# Patient Record
Sex: Female | Born: 1970 | Race: White | Hispanic: No | Marital: Married | Smoking: Never smoker
Health system: Southern US, Community
[De-identification: ages and names within clinical notes are randomized; demographics above are authoritative.]

## PROBLEM LIST (undated history)

## (undated) DIAGNOSIS — G43909 Migraine, unspecified, not intractable, without status migrainosus: Secondary | ICD-10-CM

## (undated) DIAGNOSIS — F988 Other specified behavioral and emotional disorders with onset usually occurring in childhood and adolescence: Secondary | ICD-10-CM

## (undated) HISTORY — DX: Other specified behavioral and emotional disorders with onset usually occurring in childhood and adolescence: F98.8

## (undated) HISTORY — DX: Migraine, unspecified, not intractable, without status migrainosus: G43.909

## (undated) HISTORY — PX: BREAST EXCISIONAL BIOPSY: SUR124

---

## 1998-11-22 ENCOUNTER — Other Ambulatory Visit: Admission: RE | Admit: 1998-11-22 | Discharge: 1998-11-22 | Payer: Self-pay | Admitting: Obstetrics and Gynecology

## 1999-12-30 ENCOUNTER — Other Ambulatory Visit: Admission: RE | Admit: 1999-12-30 | Discharge: 1999-12-30 | Payer: Self-pay | Admitting: Obstetrics and Gynecology

## 2000-10-02 ENCOUNTER — Encounter (INDEPENDENT_AMBULATORY_CARE_PROVIDER_SITE_OTHER): Payer: Self-pay | Admitting: Specialist

## 2000-10-02 ENCOUNTER — Ambulatory Visit (HOSPITAL_COMMUNITY): Admission: RE | Admit: 2000-10-02 | Discharge: 2000-10-02 | Payer: Self-pay | Admitting: *Deleted

## 2001-03-03 ENCOUNTER — Other Ambulatory Visit: Admission: RE | Admit: 2001-03-03 | Discharge: 2001-03-03 | Payer: Self-pay | Admitting: Obstetrics and Gynecology

## 2002-12-02 ENCOUNTER — Other Ambulatory Visit: Admission: RE | Admit: 2002-12-02 | Discharge: 2002-12-02 | Payer: Self-pay | Admitting: Obstetrics and Gynecology

## 2004-04-30 ENCOUNTER — Emergency Department (HOSPITAL_COMMUNITY): Admission: EM | Admit: 2004-04-30 | Discharge: 2004-04-30 | Payer: Self-pay | Admitting: Emergency Medicine

## 2004-10-31 ENCOUNTER — Other Ambulatory Visit: Admission: RE | Admit: 2004-10-31 | Discharge: 2004-10-31 | Payer: Self-pay | Admitting: Obstetrics and Gynecology

## 2005-10-03 ENCOUNTER — Inpatient Hospital Stay (HOSPITAL_COMMUNITY): Admission: AD | Admit: 2005-10-03 | Discharge: 2005-10-05 | Payer: Self-pay | Admitting: Obstetrics & Gynecology

## 2009-07-24 ENCOUNTER — Encounter: Admission: RE | Admit: 2009-07-24 | Discharge: 2009-07-24 | Payer: Self-pay | Admitting: Obstetrics and Gynecology

## 2010-07-16 ENCOUNTER — Other Ambulatory Visit: Payer: Self-pay | Admitting: Obstetrics and Gynecology

## 2010-07-16 DIAGNOSIS — Z1231 Encounter for screening mammogram for malignant neoplasm of breast: Secondary | ICD-10-CM

## 2010-07-29 ENCOUNTER — Ambulatory Visit: Payer: Self-pay

## 2010-07-30 ENCOUNTER — Ambulatory Visit: Payer: Self-pay

## 2010-08-02 NOTE — Discharge Summary (Signed)
NAMEARVADA, SEABORN                  ACCOUNT NO.:  [id number]   MEDICAL RECORD NO.:  [id number]          PATIENT TYPE:  INP   LOCATION:  9141                          FACILITY:  [facility]   PHYSICIAN:  Gerrit Friends. Aldona Bar, M.D.   DATE OF BIRTH:  04/27/1970   DATE OF ADMISSION:  10/03/2005  DATE OF DISCHARGE:  10/05/2005                                 DISCHARGE SUMMARY   DISCHARGE DIAGNOSES:  1.  Term pregnancy, delivered 7 pounds 10 ounce female infant, Apgars 9 and 9.  2.  Blood type O+.   PROCEDURES:  1.  Normal spontaneous delivery.  2.  Midline episiotomy repair.   SUMMARY:  This 40 year old primigravida was admitted at term, in active  labor, after an uncomplicated pregnancy.  She was followed in labor by Dr.  Tenny Craw,  requested and received an epidural. She had good progression and  subsequently had a normal spontaneous delivery of a viable 7 pound 10 ounce  female infant with Apgars of 9 and 9 over [place] over an episiotomy which was cut  because of the fear of extensive tearing.  Her postpartum course was benign.  Her discharge hemoglobin was 7.8 with a white count of 16,000 and a platelet  count of 175,000.  On the morning of July 22nd she was ambulating well,  tolerating a regular diet well, having normal bowel and bladder function,  and was afebrile.  Her bottle feeding was going well and she was desirous of  discharge.  She was given all appropriate instructions and understood all  instructions well.  Discharge medications include vitamins one a day until  gone, Feosol capsules one five times a week, and she was given a  prescription for Motrin 16 mg to use q.6h. as needed for pain or cramps.  She was given instruction brochure at the time of discharge and understood  all instructions well.  Follow-up in the office will be approximately four  weeks from now.      Gerrit Friends. Aldona Bar, M.D.  Electronically Signed     RMW/MEDQ  D:  10/05/2005  T:  10/05/2005  Job:  [id number]

## 2010-08-02 NOTE — Op Note (Signed)
[facility]  Patient:    Ruth Meyer, Ruth Meyer                        MRN: [id number] Proc. Date: 10/02/00 Adm. Date:  [id number] Attending:  Vikki Ports.                           Operative Report  PREOPERATIVE DIAGNOSIS:  Anal skin tags.  POSTOPERATIVE DIAGNOSIS:  Anal skin tags.  PROCEDURE:  Excision of anal skin tags.  ANESTHESIA:  General.  SURGEON:  Catalina Lunger, M.D.  DESCRIPTION OF PROCEDURE:  The patient was taken to the operating room and placed in the supine position.  After adequate anesthesia was induced using mask technique, the patient was placed in lithotomy position.  Perianal prep was undertaken.  A large anterior anal skin tag was identified, was excised down to the base of the anoderm.  The defect was closed with a running 3-0 chromic suture.  Adequate hemostasis was ensured.  All tissues surrounding the anus were injected using 0.5 Marcaine.  Sterile dressing was applied.  The patient tolerated the procedure well and went to PACU in good condition. DD:  10/02/00 TD:  10/02/00 Job: [id number]

## 2010-08-06 ENCOUNTER — Ambulatory Visit: Payer: Self-pay

## 2010-08-20 ENCOUNTER — Ambulatory Visit
Admission: RE | Admit: 2010-08-20 | Discharge: 2010-08-20 | Disposition: A | Discharge status: Home / Self Care | Payer: PRIVATE HEALTH INSURANCE | Source: Ambulatory Visit | Attending: Obstetrics and Gynecology | Admitting: Obstetrics and Gynecology

## 2010-08-20 DIAGNOSIS — Z1231 Encounter for screening mammogram for malignant neoplasm of breast: Secondary | ICD-10-CM

## 2011-01-22 ENCOUNTER — Other Ambulatory Visit: Payer: Self-pay | Admitting: Orthopedic Surgery

## 2011-01-22 DIAGNOSIS — M545 Low back pain, unspecified: Secondary | ICD-10-CM

## 2011-01-25 ENCOUNTER — Ambulatory Visit
Admission: RE | Admit: 2011-01-25 | Discharge: 2011-01-25 | Disposition: A | Discharge status: Home / Self Care | Payer: PRIVATE HEALTH INSURANCE | Source: Ambulatory Visit | Attending: Orthopedic Surgery | Admitting: Orthopedic Surgery

## 2011-01-25 DIAGNOSIS — M545 Low back pain: Secondary | ICD-10-CM

## 2011-08-26 ENCOUNTER — Other Ambulatory Visit: Payer: Self-pay | Admitting: Obstetrics and Gynecology

## 2011-08-26 DIAGNOSIS — R928 Other abnormal and inconclusive findings on diagnostic imaging of breast: Secondary | ICD-10-CM

## 2011-09-02 ENCOUNTER — Ambulatory Visit
Admission: RE | Admit: 2011-09-02 | Discharge: 2011-09-02 | Disposition: A | Discharge status: Home / Self Care | Payer: PRIVATE HEALTH INSURANCE | Source: Ambulatory Visit | Attending: Obstetrics and Gynecology | Admitting: Obstetrics and Gynecology

## 2011-09-02 DIAGNOSIS — R928 Other abnormal and inconclusive findings on diagnostic imaging of breast: Secondary | ICD-10-CM

## 2012-04-02 ENCOUNTER — Ambulatory Visit: Payer: PRIVATE HEALTH INSURANCE | Admitting: Family Medicine

## 2012-11-17 ENCOUNTER — Other Ambulatory Visit: Payer: Self-pay

## 2012-11-17 DIAGNOSIS — Z1231 Encounter for screening mammogram for malignant neoplasm of breast: Secondary | ICD-10-CM

## 2012-11-30 ENCOUNTER — Ambulatory Visit
Admission: RE | Admit: 2012-11-30 | Discharge: 2012-11-30 | Disposition: A | Discharge status: Home / Self Care | Payer: BC Managed Care – PPO | Source: Ambulatory Visit

## 2012-11-30 DIAGNOSIS — Z1231 Encounter for screening mammogram for malignant neoplasm of breast: Secondary | ICD-10-CM

## 2012-12-02 ENCOUNTER — Other Ambulatory Visit: Payer: Self-pay | Admitting: Obstetrics and Gynecology

## 2012-12-02 DIAGNOSIS — R928 Other abnormal and inconclusive findings on diagnostic imaging of breast: Secondary | ICD-10-CM

## 2012-12-03 ENCOUNTER — Ambulatory Visit
Admission: RE | Admit: 2012-12-03 | Discharge: 2012-12-03 | Disposition: A | Discharge status: Home / Self Care | Payer: BC Managed Care – PPO | Source: Ambulatory Visit | Attending: Obstetrics and Gynecology | Admitting: Obstetrics and Gynecology

## 2012-12-03 DIAGNOSIS — R928 Other abnormal and inconclusive findings on diagnostic imaging of breast: Secondary | ICD-10-CM

## 2014-02-21 ENCOUNTER — Other Ambulatory Visit: Payer: Self-pay | Admitting: Obstetrics and Gynecology

## 2014-02-21 DIAGNOSIS — R928 Other abnormal and inconclusive findings on diagnostic imaging of breast: Secondary | ICD-10-CM

## 2014-03-01 ENCOUNTER — Ambulatory Visit
Admission: RE | Admit: 2014-03-01 | Discharge: 2014-03-01 | Disposition: A | Discharge status: Home / Self Care | Payer: BC Managed Care – PPO | Source: Ambulatory Visit | Attending: Obstetrics and Gynecology | Admitting: Obstetrics and Gynecology

## 2014-03-01 DIAGNOSIS — R928 Other abnormal and inconclusive findings on diagnostic imaging of breast: Secondary | ICD-10-CM

## 2014-03-06 ENCOUNTER — Other Ambulatory Visit: Payer: BC Managed Care – PPO

## 2015-02-22 ENCOUNTER — Other Ambulatory Visit: Payer: Self-pay

## 2015-02-22 DIAGNOSIS — Z1231 Encounter for screening mammogram for malignant neoplasm of breast: Secondary | ICD-10-CM

## 2015-03-14 ENCOUNTER — Other Ambulatory Visit: Payer: Self-pay

## 2015-03-14 ENCOUNTER — Ambulatory Visit
Admission: RE | Admit: 2015-03-14 | Discharge: 2015-03-14 | Disposition: A | Discharge status: Home / Self Care | Payer: Managed Care, Other (non HMO) | Source: Ambulatory Visit

## 2015-03-14 DIAGNOSIS — Z1231 Encounter for screening mammogram for malignant neoplasm of breast: Secondary | ICD-10-CM

## 2015-11-08 ENCOUNTER — Other Ambulatory Visit: Payer: Self-pay | Admitting: Obstetrics and Gynecology

## 2015-11-08 DIAGNOSIS — N63 Unspecified lump in unspecified breast: Secondary | ICD-10-CM

## 2015-11-13 ENCOUNTER — Ambulatory Visit
Admission: RE | Admit: 2015-11-13 | Discharge: 2015-11-13 | Disposition: A | Discharge status: Home / Self Care | Payer: Managed Care, Other (non HMO) | Source: Ambulatory Visit | Attending: Obstetrics and Gynecology | Admitting: Obstetrics and Gynecology

## 2015-11-13 ENCOUNTER — Other Ambulatory Visit: Payer: Self-pay | Admitting: Obstetrics and Gynecology

## 2015-11-13 DIAGNOSIS — N63 Unspecified lump in unspecified breast: Secondary | ICD-10-CM

## 2015-11-16 ENCOUNTER — Ambulatory Visit
Admission: RE | Admit: 2015-11-16 | Discharge: 2015-11-16 | Disposition: A | Discharge status: Home / Self Care | Payer: Managed Care, Other (non HMO) | Source: Ambulatory Visit | Attending: Obstetrics and Gynecology | Admitting: Obstetrics and Gynecology

## 2015-11-16 ENCOUNTER — Other Ambulatory Visit: Payer: Self-pay | Admitting: Obstetrics and Gynecology

## 2015-11-16 DIAGNOSIS — N63 Unspecified lump in unspecified breast: Secondary | ICD-10-CM

## 2015-11-22 ENCOUNTER — Other Ambulatory Visit: Payer: Self-pay | Admitting: Obstetrics and Gynecology

## 2015-11-22 DIAGNOSIS — Z803 Family history of malignant neoplasm of breast: Secondary | ICD-10-CM

## 2015-11-22 DIAGNOSIS — N6489 Other specified disorders of breast: Secondary | ICD-10-CM

## 2015-12-01 ENCOUNTER — Ambulatory Visit
Admission: RE | Admit: 2015-12-01 | Discharge: 2015-12-01 | Disposition: A | Discharge status: Home / Self Care | Payer: Managed Care, Other (non HMO) | Source: Ambulatory Visit | Attending: Obstetrics and Gynecology | Admitting: Obstetrics and Gynecology

## 2015-12-01 DIAGNOSIS — N6489 Other specified disorders of breast: Secondary | ICD-10-CM

## 2015-12-01 DIAGNOSIS — Z803 Family history of malignant neoplasm of breast: Secondary | ICD-10-CM

## 2015-12-01 MED ORDER — GADOBENATE DIMEGLUMINE 529 MG/ML IV SOLN
20.0000 mL | Freq: Once | INTRAVENOUS | Status: AC | PRN
  Administered 2015-12-01: 20 mL via INTRAVENOUS

## 2016-03-17 HISTORY — PX: BREAST BIOPSY: SHX20

## 2016-04-22 ENCOUNTER — Other Ambulatory Visit: Payer: Self-pay | Admitting: Obstetrics and Gynecology

## 2016-04-22 DIAGNOSIS — N63 Unspecified lump in unspecified breast: Secondary | ICD-10-CM

## 2016-05-26 ENCOUNTER — Ambulatory Visit
Admission: RE | Admit: 2016-05-26 | Discharge: 2016-05-26 | Disposition: A | Discharge status: Home / Self Care | Payer: Managed Care, Other (non HMO) | Source: Ambulatory Visit | Attending: Obstetrics and Gynecology | Admitting: Obstetrics and Gynecology

## 2016-05-26 DIAGNOSIS — N63 Unspecified lump in unspecified breast: Secondary | ICD-10-CM

## 2017-08-12 ENCOUNTER — Other Ambulatory Visit: Payer: Self-pay | Admitting: Obstetrics and Gynecology

## 2017-08-12 DIAGNOSIS — Z1231 Encounter for screening mammogram for malignant neoplasm of breast: Secondary | ICD-10-CM

## 2017-09-01 ENCOUNTER — Ambulatory Visit
Admission: RE | Admit: 2017-09-01 | Discharge: 2017-09-01 | Disposition: A | Discharge status: Home / Self Care | Payer: Managed Care, Other (non HMO) | Source: Ambulatory Visit | Attending: Obstetrics and Gynecology | Admitting: Obstetrics and Gynecology

## 2017-09-01 DIAGNOSIS — Z1231 Encounter for screening mammogram for malignant neoplasm of breast: Secondary | ICD-10-CM

## 2018-09-10 ENCOUNTER — Other Ambulatory Visit: Payer: Self-pay | Admitting: Obstetrics and Gynecology

## 2018-09-10 DIAGNOSIS — Z1231 Encounter for screening mammogram for malignant neoplasm of breast: Secondary | ICD-10-CM

## 2018-11-01 ENCOUNTER — Ambulatory Visit
Admission: RE | Admit: 2018-11-01 | Discharge: 2018-11-01 | Disposition: A | Discharge status: Home / Self Care | Payer: BC Managed Care – PPO | Source: Ambulatory Visit | Attending: Obstetrics and Gynecology | Admitting: Obstetrics and Gynecology

## 2018-11-01 ENCOUNTER — Other Ambulatory Visit: Payer: Self-pay

## 2018-11-01 DIAGNOSIS — Z1231 Encounter for screening mammogram for malignant neoplasm of breast: Secondary | ICD-10-CM

## 2019-07-13 ENCOUNTER — Other Ambulatory Visit: Payer: Self-pay | Admitting: Obstetrics and Gynecology

## 2019-07-13 DIAGNOSIS — Z1231 Encounter for screening mammogram for malignant neoplasm of breast: Secondary | ICD-10-CM

## 2019-11-02 ENCOUNTER — Other Ambulatory Visit: Payer: Self-pay

## 2019-11-02 ENCOUNTER — Ambulatory Visit
Admission: RE | Admit: 2019-11-02 | Discharge: 2019-11-02 | Disposition: A | Discharge status: Home / Self Care | Payer: BC Managed Care – PPO | Source: Ambulatory Visit | Attending: Obstetrics and Gynecology | Admitting: Obstetrics and Gynecology

## 2019-11-02 DIAGNOSIS — Z1231 Encounter for screening mammogram for malignant neoplasm of breast: Secondary | ICD-10-CM

## 2020-08-27 ENCOUNTER — Other Ambulatory Visit: Payer: Self-pay | Admitting: Obstetrics and Gynecology

## 2020-08-27 DIAGNOSIS — Z1231 Encounter for screening mammogram for malignant neoplasm of breast: Secondary | ICD-10-CM

## 2020-11-07 ENCOUNTER — Other Ambulatory Visit: Payer: Self-pay

## 2020-11-07 ENCOUNTER — Ambulatory Visit
Admission: RE | Admit: 2020-11-07 | Discharge: 2020-11-07 | Disposition: A | Discharge status: Home / Self Care | Payer: BC Managed Care – PPO | Source: Ambulatory Visit | Attending: Obstetrics and Gynecology | Admitting: Obstetrics and Gynecology

## 2020-11-07 DIAGNOSIS — Z1231 Encounter for screening mammogram for malignant neoplasm of breast: Secondary | ICD-10-CM

## 2021-06-06 ENCOUNTER — Encounter: Payer: Self-pay | Admitting: Internal Medicine

## 2021-06-11 ENCOUNTER — Ambulatory Visit: Payer: BC Managed Care – PPO

## 2021-06-11 VITALS — Ht 68.0 in | Wt 245.0 lb

## 2021-06-11 NOTE — Progress Notes (Unsigned)
? ?   Pt rescheduled procedure to June ; PV rescheduled to  ? ?  ?

## 2021-06-27 ENCOUNTER — Ambulatory Visit (AMBULATORY_SURGERY_CENTER): Payer: Self-pay | Admitting: *Deleted

## 2021-06-27 ENCOUNTER — Other Ambulatory Visit: Payer: Self-pay

## 2021-06-27 VITALS — Ht 68.0 in | Wt 245.0 lb

## 2021-06-27 DIAGNOSIS — Z1211 Encounter for screening for malignant neoplasm of colon: Secondary | ICD-10-CM

## 2021-06-27 MED ORDER — NA SULFATE-K SULFATE-MG SULF 17.5-3.13-1.6 GM/177ML PO SOLN: 1.0000 | Freq: Once | ORAL | 0 refills | Status: AC

## 2021-06-27 NOTE — Progress Notes (Signed)
Virtual pre visit completed over telephone.  ?Instructions mailed to home address. ? ?No egg or soy allergy known to patient  ?No issues known to pt with past sedation with any surgeries or procedures ?Patient denies ever being told they had issues or difficulty with intubation  ?No FH of Malignant Hyperthermia ?Pt is not on diet pills ?Pt is not on  home 02  ?Pt is not on blood thinners  ?Pt denies issues with constipation  ?No A fib or A flutter ? ?Discussed with pt there will be an out-of-pocket cost for prep and that varies from $0 to 70 +  dollars - pt verbalized understanding  ?Pt instructed to use Singlecare.com or GoodRx for a price reduction on prep  ? ?PV completed over the phone. Pt verified name, DOB, address and insurance during PV today.  ?Pt mailed instruction packet with copy of consent form to read and not return, and instructions.  ?Pt encouraged to call with questions or issues.  ?If pt has My chart, procedure instructions sent via My Chart   ?

## 2021-07-15 ENCOUNTER — Encounter: Payer: BC Managed Care – PPO | Admitting: Internal Medicine

## 2021-07-24 ENCOUNTER — Other Ambulatory Visit: Payer: Self-pay | Admitting: Obstetrics and Gynecology

## 2021-07-24 DIAGNOSIS — Z1231 Encounter for screening mammogram for malignant neoplasm of breast: Secondary | ICD-10-CM

## 2021-08-15 ENCOUNTER — Encounter: Payer: Self-pay | Admitting: Internal Medicine

## 2021-08-19 ENCOUNTER — Encounter: Payer: Self-pay | Admitting: Internal Medicine

## 2021-08-19 ENCOUNTER — Ambulatory Visit (AMBULATORY_SURGERY_CENTER): Payer: 59 | Admitting: Internal Medicine

## 2021-08-19 VITALS — BP 153/67 | HR 60 | Temp 97.3°F | Resp 11 | Ht 68.0 in | Wt 245.0 lb

## 2021-08-19 DIAGNOSIS — K6289 Other specified diseases of anus and rectum: Secondary | ICD-10-CM

## 2021-08-19 DIAGNOSIS — Z1211 Encounter for screening for malignant neoplasm of colon: Secondary | ICD-10-CM | POA: Diagnosis present

## 2021-08-19 DIAGNOSIS — K639 Disease of intestine, unspecified: Secondary | ICD-10-CM | POA: Diagnosis not present

## 2021-08-19 MED ORDER — SODIUM CHLORIDE 0.9 % IV SOLN: 500.0000 mL | Freq: Once | INTRAVENOUS | Status: DC

## 2021-08-19 NOTE — Progress Notes (Signed)
Pt non-responsive, VVS, Report to RN  °

## 2021-08-19 NOTE — Progress Notes (Signed)
GASTROENTEROLOGY PROCEDURE H&P NOTE   Primary Care Physician: Patient, No Pcp Per (Inactive)    Reason for Procedure:   Colon cancer screening  Plan:    Colonoscopy  Patient is appropriate for endoscopic procedure(s) in the ambulatory ([facility]) setting.  The nature of the procedure, as well as the risks, benefits, and alternatives were carefully and thoroughly reviewed with the patient. Ample time for discussion and questions allowed. The patient understood, was satisfied, and agreed to proceed.     HPI: Ruth Meyer is a 51 y.o. female who presents for colonoscopy for colon cancer screening. Denies blood in stools, changes in bowel habits, weight loss. Denies fam hx of colon cancer.  Past Medical History:  Diagnosis Date   ADD (attention deficit disorder)    Migraines     Past Surgical History:  Procedure Laterality Date   BREAST BIOPSY Right 2018   benign   BREAST EXCISIONAL BIOPSY Left     Prior to Admission medications   Medication Sig Start Date End Date Taking? Authorizing Provider  Amphetamine ER (ADZENYS XR-ODT) 15.7 MG TBED Take by mouth.   Yes [provider]  FLUoxetine (PROZAC) 20 MG capsule Take 20 mg by mouth daily. 04/11/21  Yes [provider]  levonorgestrel (MIRENA, 52 MG,) 20 MCG/DAY IUD 52 Intra Uterine Devices by Intrauterine route daily. 06/25/16  Yes [provider]    Current Outpatient Medications  Medication Sig Dispense Refill   Amphetamine ER (ADZENYS XR-ODT) 15.7 MG TBED Take by mouth.     FLUoxetine (PROZAC) 20 MG capsule Take 20 mg by mouth daily.     levonorgestrel (MIRENA, 52 MG,) 20 MCG/DAY IUD 52 Intra Uterine Devices by Intrauterine route daily.     Current Facility-Administered Medications  Medication Dose Route Frequency Provider Last Rate Last Admin   0.9 %  sodium chloride infusion  500 mL Intravenous Once Imogene Burn, MD        Allergies as of 08/19/2021   (Not on File)    Family History   Problem Relation Age of Onset   Breast cancer Paternal Aunt    Colon polyps Neg Hx    Colon cancer Neg Hx    Esophageal cancer Neg Hx    Stomach cancer Neg Hx    Rectal cancer Neg Hx     Social History   Socioeconomic History   Marital status: Married    Spouse name: Not on file   Number of children: Not on file   Years of education: Not on file   Highest education level: Not on file  Occupational History   Not on file  Tobacco Use   Smoking status: Never    Passive exposure: Never   Smokeless tobacco: Never  Substance and Sexual Activity   Alcohol use: Never   Drug use: Never   Sexual activity: Not on file  Other Topics Concern   Not on file  Social History Narrative   Not on file   Social Determinants of Health   Financial Resource Strain: Not on file  Food Insecurity: Not on file  Transportation Needs: Not on file  Physical Activity: Not on file  Stress: Not on file  Social Connections: Not on file  Intimate Partner Violence: Not on file    Physical Exam: Vital signs in last 24 hours: BP 129/78   Pulse 69   Temp (!) 97.3 F (36.3 C) (Temporal)   Ht 5\' 8"  (1.727 m)   Wt 245 lb (111.1  kg)   SpO2 96%   BMI 37.25 kg/m  GEN: NAD EYE: Sclerae anicteric ENT: MMM CV: Non-tachycardic Pulm: No increased work of breathing GI: Soft, NT/ND NEURO:  Alert & Oriented   Eulah Pont, MD [facility]  08/19/2021 8:05 AM

## 2021-08-19 NOTE — Patient Instructions (Signed)
Await pathology results of anal papilla.  Repeat colonoscopy in 10 years for surveillance.    YOU HAD AN ENDOSCOPIC PROCEDURE TODAY AT THE [facility]:   Refer to the procedure report that was given to you for any specific questions about what was found during the examination.  If the procedure report does not answer your questions, please call your gastroenterologist to clarify.  If you requested that your care partner not be given the details of your procedure findings, then the procedure report has been included in a sealed envelope for you to review at your convenience later.  YOU SHOULD EXPECT: Some feelings of bloating in the abdomen. Passage of more gas than usual.  Walking can help get rid of the air that was put into your GI tract during the procedure and reduce the bloating. If you had a lower endoscopy (such as a colonoscopy or flexible sigmoidoscopy) you may notice spotting of blood in your stool or on the toilet paper. If you underwent a bowel prep for your procedure, you may not have a normal bowel movement for a few days.  Please Note:  You might notice some irritation and congestion in your nose or some drainage.  This is from the oxygen used during your procedure.  There is no need for concern and it should clear up in a day or so.  SYMPTOMS TO REPORT IMMEDIATELY:  Following lower endoscopy (colonoscopy or flexible sigmoidoscopy):  Excessive amounts of blood in the stool  Significant tenderness or worsening of abdominal pains  Swelling of the abdomen that is new, acute  Fever of 100F or higher   For urgent or emergent issues, a gastroenterologist can be reached at any hour by calling [phone]. Do not use MyChart messaging for urgent concerns.    DIET:  We do recommend a small meal at first, but then you may proceed to your regular diet.  Drink plenty of fluids but you should avoid alcoholic beverages for 24 hours.  ACTIVITY:  You should plan to take it easy  for the rest of today and you should NOT DRIVE or use heavy machinery until tomorrow (because of the sedation medicines used during the test).    FOLLOW UP: Our staff will call the number listed on your records 24-72 hours following your procedure to check on you and address any questions or concerns that you may have regarding the information given to you following your procedure. If we do not reach you, we will leave a message.  We will attempt to reach you two times.  During this call, we will ask if you have developed any symptoms of COVID 19. If you develop any symptoms (ie: fever, flu-like symptoms, shortness of breath, cough etc.) before then, please call [phone].  If you test positive for Covid 19 in the 2 weeks post procedure, please call and report this information to [place].    If any biopsies were taken you will be contacted by phone or by letter within the next 1-3 weeks.  Please call us at [phone] if you have not heard about the biopsies in 3 weeks.    SIGNATURES/CONFIDENTIALITY: You and/or your care partner have signed paperwork which will be entered into your electronic medical record.  These signatures attest to the fact that that the information above on your After Visit Summary has been reviewed and is understood.  Full responsibility of the confidentiality of this discharge information lies with you and/or your care-partner.

## 2021-08-19 NOTE — Progress Notes (Signed)
Pt's states no medical or surgical changes since previsit or office visit. 

## 2021-08-19 NOTE — Op Note (Signed)
[facility] Patient Name: Ruth Meyer Procedure Date: 08/19/2021 8:00 AM MRN: [id number] Endoscopist: Nicole Kindred "Ruth Meyer ,  Age: 51 Referring MD:  Date of Birth: [birth year] Gender: Female Account #: [id number] Procedure:                Colonoscopy Indications:              Screening for colorectal malignant neoplasm, This                            is the patient's first colonoscopy Medicines:                Monitored Anesthesia Care Procedure:                Pre-Anesthesia Assessment:                           - Prior to the procedure, a History and Physical                            was performed, and patient medications and                            allergies were reviewed. The patient's tolerance of                            previous anesthesia was also reviewed. The risks                            and benefits of the procedure and the sedation                            options and risks were discussed with the patient.                            All questions were answered, and informed consent                            was obtained. Prior Anticoagulants: The patient has                            taken no previous anticoagulant or antiplatelet                            agents. ASA Grade Assessment: II - A patient with                            mild systemic disease. After reviewing the risks                            and benefits, the patient was deemed in                            satisfactory condition to undergo the procedure.  After obtaining informed consent, the colonoscope                            was passed under direct vision. Throughout the                            procedure, the patient's blood pressure, pulse, and                            oxygen saturations were monitored continuously. The                            Olympus CF-HQ190L (Serial# [id number]) Colonoscope was                            introduced through the  anus and advanced to the the                            terminal ileum. The colonoscopy was performed                            without difficulty. The patient tolerated the                            procedure well. The quality of the bowel                            preparation was good. The terminal ileum, ileocecal                            valve, appendiceal orifice, and rectum were                            photographed. Scope In: 8:07:11 AM Scope Out: 8:25:31 AM Scope Withdrawal Time: 0 hours 12 minutes 52 seconds  Total Procedure Duration: 0 hours 18 minutes 20 seconds  Findings:                 The terminal ileum appeared normal.                           Non-bleeding internal hemorrhoids were found during                            retroflexion.                           Anal papilla(e) were hypertrophied. There was one                            area of the papilla that was particularly prominent                            so biopsies were taken with a cold forceps for  histology. Complications:            No immediate complications. Estimated Blood Loss:     Estimated blood loss was minimal. Impression:               - The examined portion of the ileum was normal.                           - Non-bleeding internal hemorrhoids.                           - Anal papilla(e) were hypertrophied. Biopsied. Recommendation:           - Discharge patient to home (with escort).                           - Repeat colonoscopy in 10 years for screening                            purposes.                           - Await pathology results.                           - The findings and recommendations were discussed                            with the patient. Nicole KindredYing "Ruth PontClaire" Delpha Perko,  08/19/2021 8:33:28 AM

## 2021-08-19 NOTE — Progress Notes (Signed)
Called to room to assist during endoscopic procedure.  Patient ID and intended procedure confirmed with present staff. Received instructions for my participation in the procedure from the performing physician.  

## 2021-08-20 ENCOUNTER — Telehealth: Payer: Self-pay | Admitting: *Deleted

## 2021-08-20 NOTE — Telephone Encounter (Signed)
No answer on first attempt follow up call. Left message.  ?

## 2021-08-20 NOTE — Telephone Encounter (Signed)
Second attempt, left VM.  

## 2021-08-22 ENCOUNTER — Encounter: Payer: Self-pay | Admitting: Internal Medicine

## 2021-08-27 ENCOUNTER — Encounter: Payer: BC Managed Care – PPO | Admitting: Internal Medicine

## 2021-11-12 ENCOUNTER — Ambulatory Visit
Admission: RE | Admit: 2021-11-12 | Discharge: 2021-11-12 | Disposition: A | Discharge status: Home / Self Care | Payer: 59 | Source: Ambulatory Visit

## 2021-11-12 DIAGNOSIS — Z1231 Encounter for screening mammogram for malignant neoplasm of breast: Secondary | ICD-10-CM

## 2022-01-11 IMAGING — MG MM DIGITAL SCREENING BILAT W/ TOMO AND CAD
8 series · 9 of 24 positions shown · non-contrast
Comparison: Previous exam(s).

CLINICAL DATA: Screening.

EXAM:
DIGITAL SCREENING BILATERAL MAMMOGRAM WITH TOMOSYNTHESIS AND CAD
TECHNIQUE: Bilateral screening digital craniocaudal and mediolateral oblique
mammograms were obtained. Bilateral screening digital breast
tomosynthesis was performed. The images were evaluated with
computer-aided detection.

[R CC synth-2D]
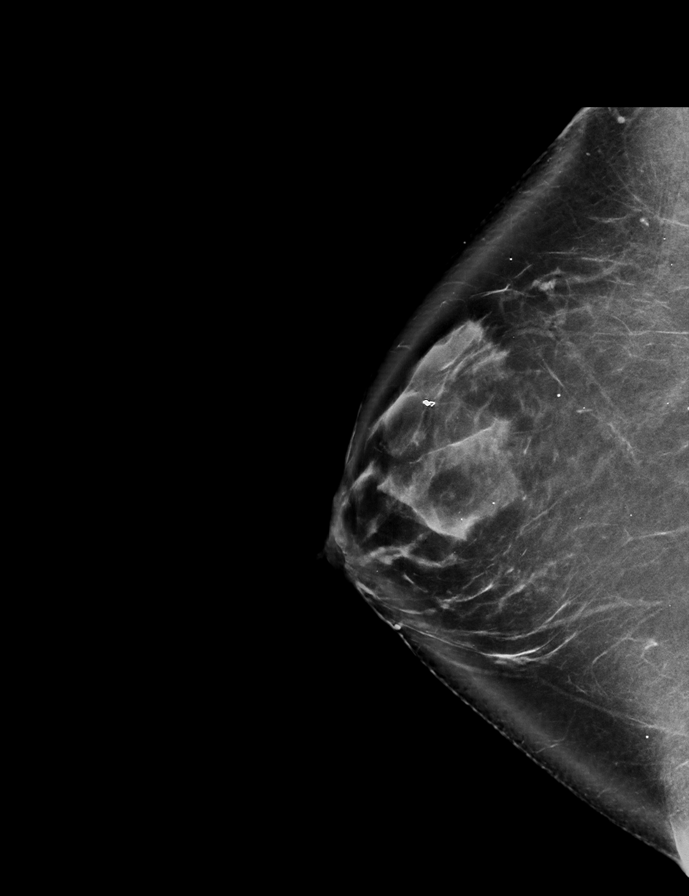

[R MLO synth-2D]
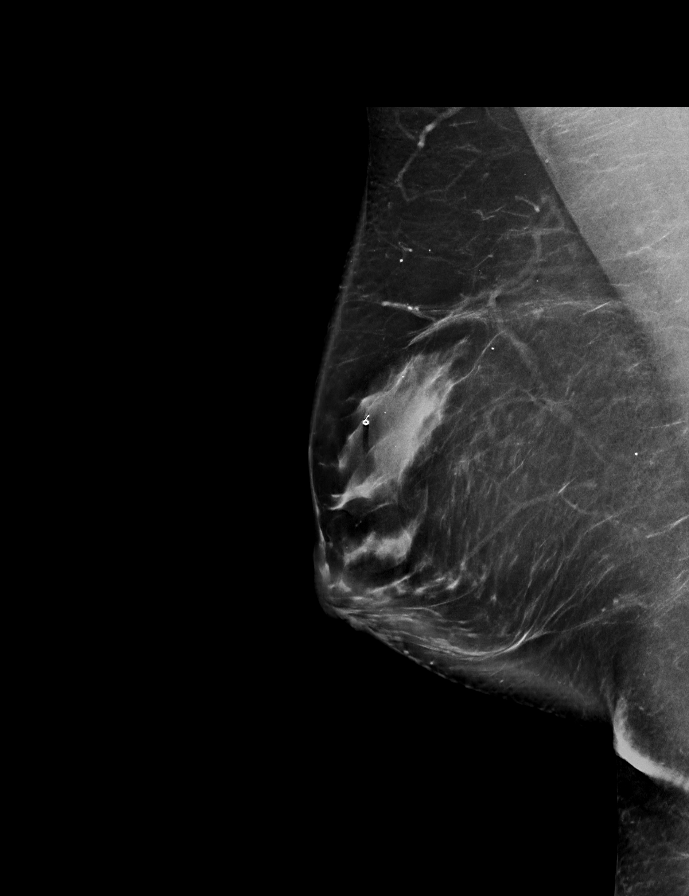

[L CC synth-2D]
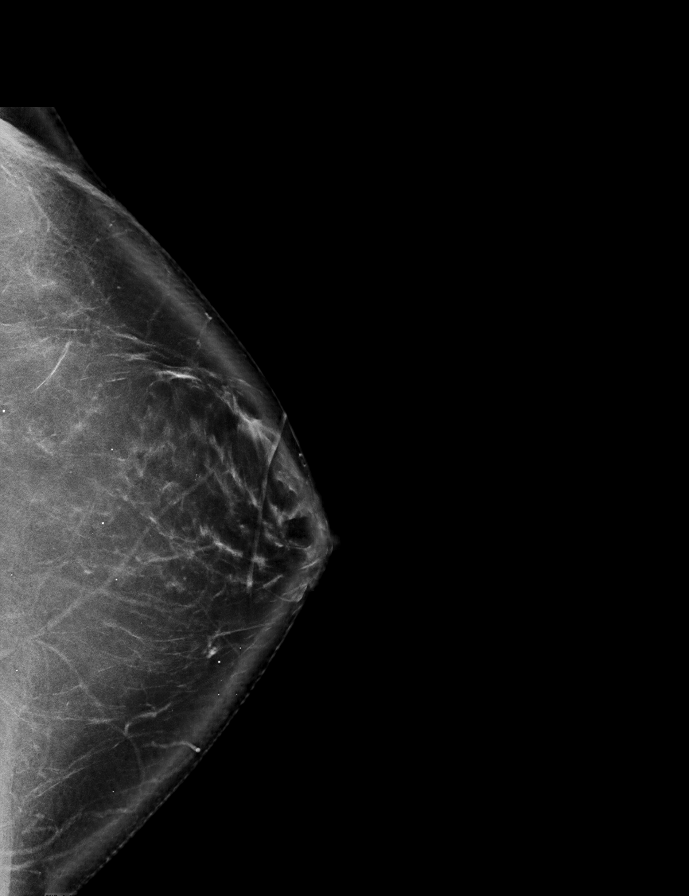

[L MLO synth-2D]
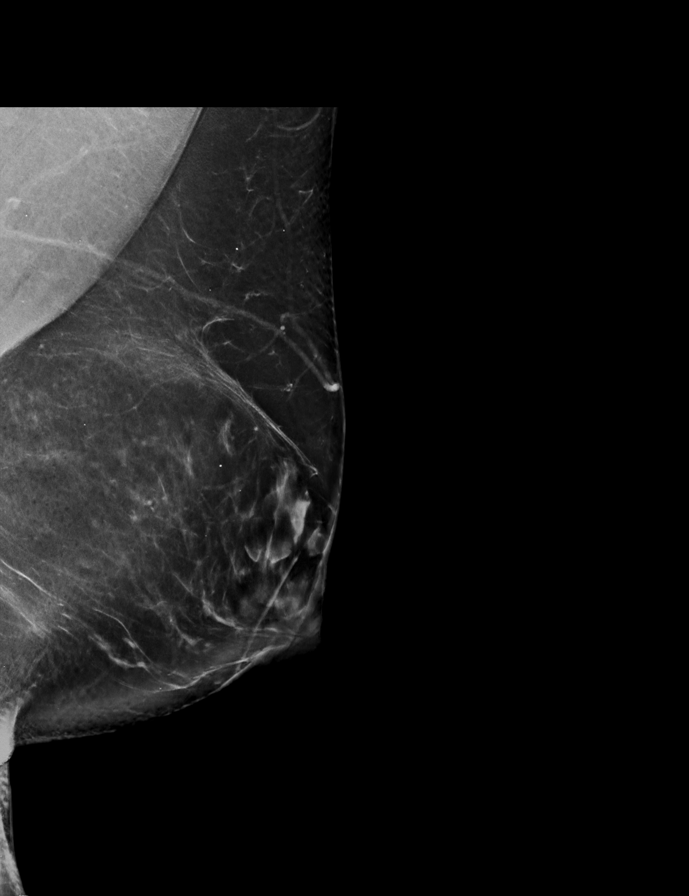

[L MLO tomo · 2 of 94 frames shown]
[frame 31/94]
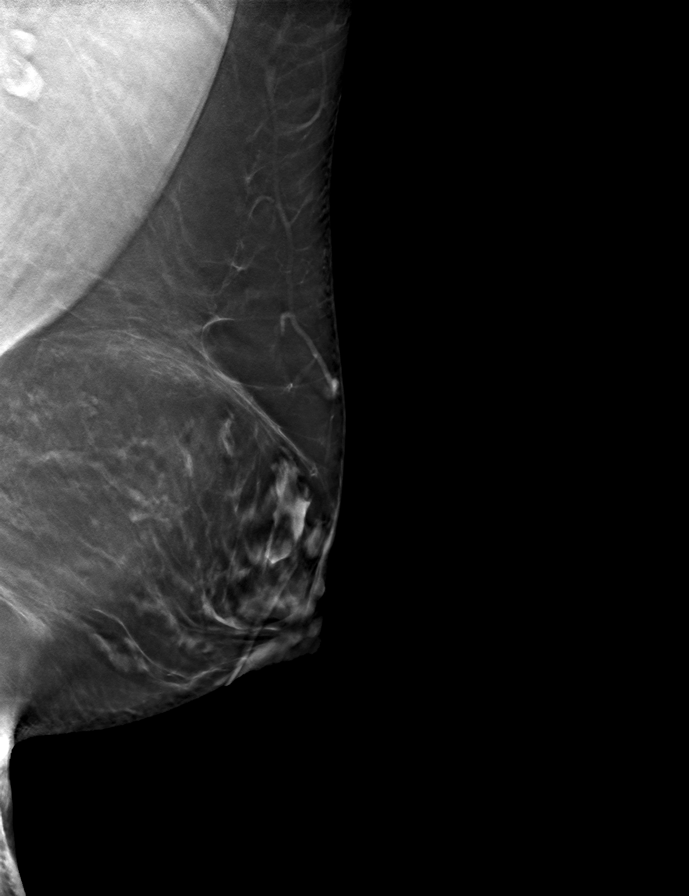
[frame 47/94]
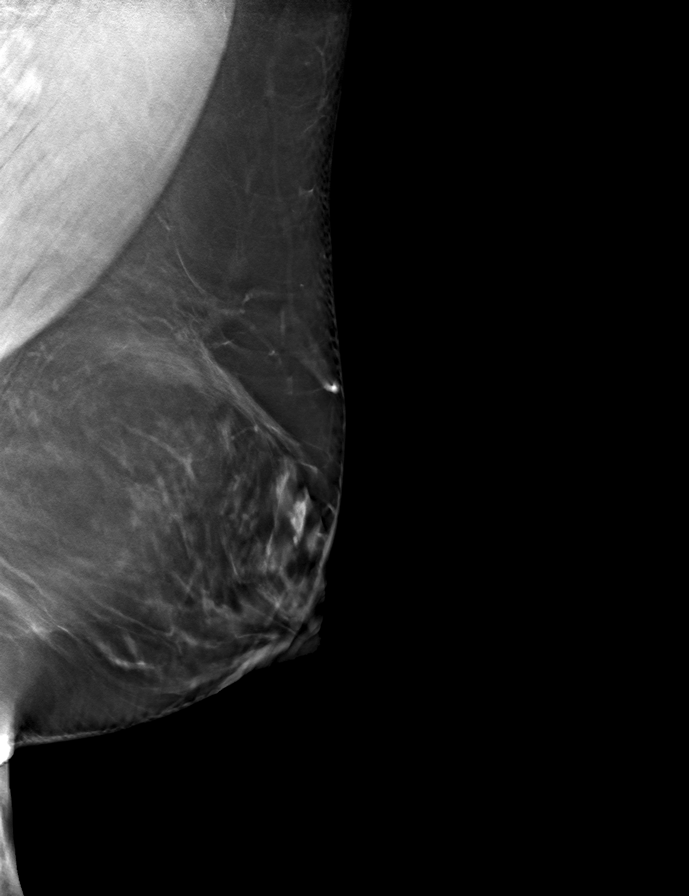

[L CC tomo · tomo slice 49/97.0]
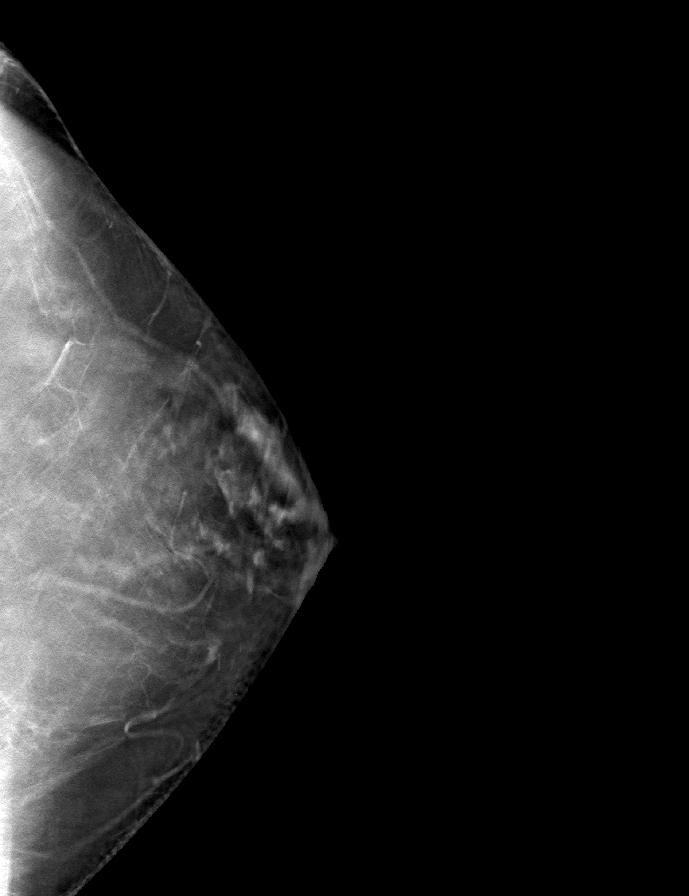

[R MLO tomo · tomo slice 51/101.0]
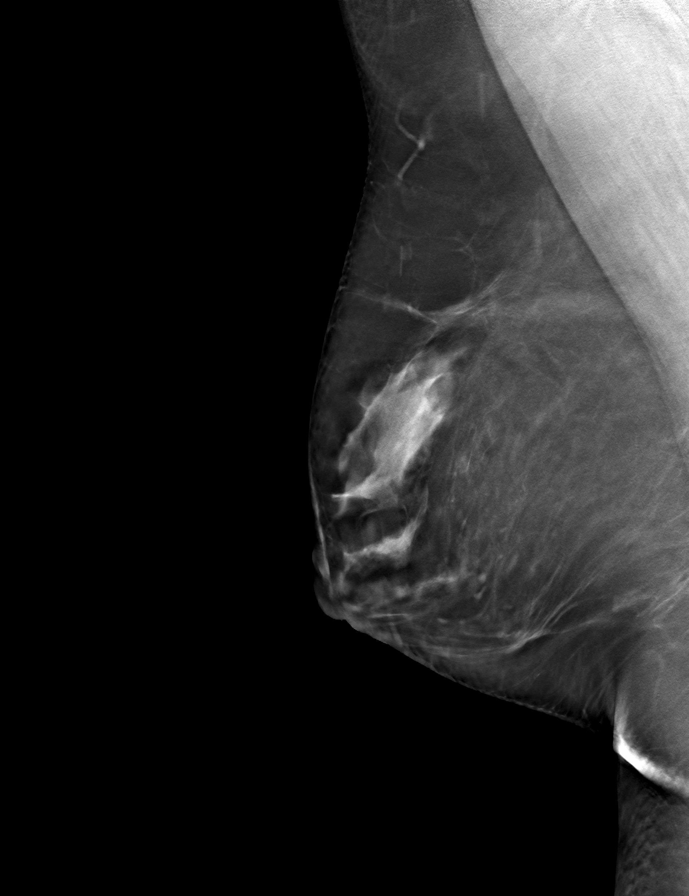

[R CC tomo · tomo slice 47/93.0]
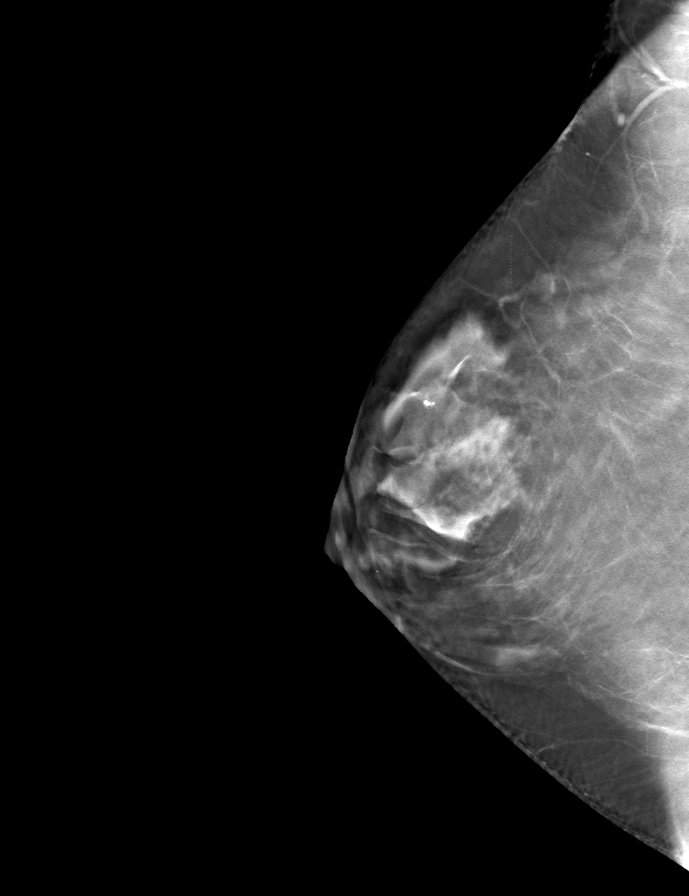

[9 of 24 positions shown; findings below may reference images not displayed]

ACR Breast Density Category b: There are scattered areas of
fibroglandular density.
FINDINGS: There are no findings suspicious for malignancy.
IMPRESSION: No mammographic evidence of malignancy. A result letter of this
screening mammogram will be mailed directly to the patient.

RECOMMENDATION:
Screening mammogram in one year. (Code:51-O-LD2)

BI-RADS CATEGORY  1: Negative.

## 2022-09-12 ENCOUNTER — Other Ambulatory Visit: Payer: Self-pay | Admitting: Obstetrics and Gynecology

## 2022-09-12 DIAGNOSIS — Z1231 Encounter for screening mammogram for malignant neoplasm of breast: Secondary | ICD-10-CM

## 2022-11-14 ENCOUNTER — Ambulatory Visit: Admission: RE | Admit: 2022-11-14 | Payer: 59 | Source: Ambulatory Visit

## 2022-11-14 DIAGNOSIS — Z1231 Encounter for screening mammogram for malignant neoplasm of breast: Secondary | ICD-10-CM

## 2023-10-19 ENCOUNTER — Other Ambulatory Visit: Payer: Self-pay | Admitting: Obstetrics and Gynecology

## 2023-10-19 DIAGNOSIS — Z1231 Encounter for screening mammogram for malignant neoplasm of breast: Secondary | ICD-10-CM

## 2023-11-18 ENCOUNTER — Ambulatory Visit
Admission: RE | Admit: 2023-11-18 | Discharge: 2023-11-18 | Disposition: A | Discharge status: Home / Self Care | Source: Ambulatory Visit

## 2023-11-18 DIAGNOSIS — Z1231 Encounter for screening mammogram for malignant neoplasm of breast: Secondary | ICD-10-CM
